# Patient Record
Sex: Female | Born: 2000 | Race: White | Hispanic: No | Marital: Single | Smoking: Never smoker
Health system: Southern US, Community
[De-identification: ages and names within clinical notes are randomized; demographics above are authoritative.]

---
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
## 2020-12-19 ENCOUNTER — Emergency Department (HOSPITAL_COMMUNITY): Admission: EM | Admit: 2020-12-19 | Discharge: 2020-12-19 | Discharge status: Against Medical Advice | Payer: Self-pay

## 2020-12-24 ENCOUNTER — Emergency Department (HOSPITAL_BASED_OUTPATIENT_CLINIC_OR_DEPARTMENT_OTHER)
Admission: EM | Admit: 2020-12-24 | Discharge: 2020-12-25 | Disposition: A | Discharge status: Home / Self Care | Payer: BC Managed Care – PPO | Attending: Emergency Medicine | Admitting: Emergency Medicine

## 2020-12-24 ENCOUNTER — Other Ambulatory Visit: Payer: Self-pay

## 2020-12-24 ENCOUNTER — Emergency Department (HOSPITAL_BASED_OUTPATIENT_CLINIC_OR_DEPARTMENT_OTHER): Payer: BC Managed Care – PPO

## 2020-12-24 ENCOUNTER — Encounter (HOSPITAL_BASED_OUTPATIENT_CLINIC_OR_DEPARTMENT_OTHER): Payer: Self-pay | Admitting: *Deleted

## 2020-12-24 DIAGNOSIS — S161XXA Strain of muscle, fascia and tendon at neck level, initial encounter: Secondary | ICD-10-CM | POA: Diagnosis not present

## 2020-12-24 DIAGNOSIS — S339XXA Sprain of unspecified parts of lumbar spine and pelvis, initial encounter: Secondary | ICD-10-CM | POA: Insufficient documentation

## 2020-12-24 DIAGNOSIS — S335XXA Sprain of ligaments of lumbar spine, initial encounter: Secondary | ICD-10-CM

## 2020-12-24 DIAGNOSIS — S34109A Unspecified injury to unspecified level of lumbar spinal cord, initial encounter: Secondary | ICD-10-CM | POA: Diagnosis present

## 2020-12-24 LAB — PREGNANCY, URINE: Preg Test, Ur: NEGATIVE
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
## 2020-12-25 MED ORDER — DICLOFENAC SODIUM 1 % EX GEL: 2.0000 g | Freq: Four times a day (QID) | CUTANEOUS | 0 refills | Status: AC | PRN

## 2020-12-25 MED ORDER — METHOCARBAMOL 500 MG PO TABS: 500.0000 mg | ORAL_TABLET | Freq: Three times a day (TID) | ORAL | 0 refills | Status: AC | PRN

## 2020-12-25 NOTE — Discharge Instructions (Signed)
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
